# Patient Record
Sex: Male | Born: 1955 | Race: White | Hispanic: No | Smoking: Current every day smoker
Health system: Southern US, Community
[De-identification: ages and names within clinical notes are randomized; demographics above are authoritative.]

## PROBLEM LIST (undated history)

## (undated) DIAGNOSIS — I10 Essential (primary) hypertension: Secondary | ICD-10-CM

## (undated) DIAGNOSIS — N289 Disorder of kidney and ureter, unspecified: Secondary | ICD-10-CM

## (undated) DIAGNOSIS — E785 Hyperlipidemia, unspecified: Secondary | ICD-10-CM

## (undated) DIAGNOSIS — I2510 Atherosclerotic heart disease of native coronary artery without angina pectoris: Secondary | ICD-10-CM

## (undated) DIAGNOSIS — I219 Acute myocardial infarction, unspecified: Secondary | ICD-10-CM

## (undated) HISTORY — DX: Acute myocardial infarction, unspecified: I21.9

## (undated) HISTORY — DX: Hyperlipidemia, unspecified: E78.5

## (undated) HISTORY — DX: Atherosclerotic heart disease of native coronary artery without angina pectoris: I25.10

## (undated) HISTORY — PX: CORONARY ARTERY BYPASS GRAFT: SHX141

## (undated) HISTORY — PX: OTHER SURGICAL HISTORY: SHX169

## (undated) HISTORY — DX: Essential (primary) hypertension: I10

---

## 2004-01-03 ENCOUNTER — Inpatient Hospital Stay (HOSPITAL_COMMUNITY): Admission: EM | Admit: 2004-01-03 | Discharge: 2004-01-23 | Payer: Self-pay | Admitting: Cardiology

## 2004-01-10 ENCOUNTER — Encounter: Payer: Self-pay | Admitting: Cardiology

## 2004-07-16 ENCOUNTER — Ambulatory Visit: Payer: Self-pay | Admitting: Cardiology

## 2006-04-25 ENCOUNTER — Ambulatory Visit: Payer: Self-pay | Admitting: Cardiology

## 2006-06-23 ENCOUNTER — Ambulatory Visit: Payer: Self-pay | Admitting: Cardiology

## 2006-06-23 ENCOUNTER — Encounter: Payer: Self-pay | Admitting: Cardiology

## 2006-06-25 ENCOUNTER — Ambulatory Visit: Payer: Self-pay | Admitting: Cardiology

## 2006-06-25 ENCOUNTER — Encounter: Payer: Self-pay | Admitting: Cardiology

## 2006-07-15 ENCOUNTER — Encounter: Payer: Self-pay | Admitting: Cardiology

## 2008-06-10 ENCOUNTER — Ambulatory Visit: Payer: Self-pay | Admitting: Cardiology

## 2008-06-20 ENCOUNTER — Encounter: Payer: Self-pay | Admitting: Cardiology

## 2008-06-20 ENCOUNTER — Ambulatory Visit: Payer: Self-pay | Admitting: Cardiology

## 2009-02-21 DIAGNOSIS — E785 Hyperlipidemia, unspecified: Secondary | ICD-10-CM | POA: Insufficient documentation

## 2009-05-17 ENCOUNTER — Encounter: Payer: Self-pay | Admitting: Cardiology

## 2009-06-20 ENCOUNTER — Encounter: Payer: Self-pay | Admitting: Cardiology

## 2009-07-03 ENCOUNTER — Ambulatory Visit: Payer: Self-pay | Admitting: Cardiology

## 2009-07-03 DIAGNOSIS — I2510 Atherosclerotic heart disease of native coronary artery without angina pectoris: Secondary | ICD-10-CM

## 2009-07-03 DIAGNOSIS — F17200 Nicotine dependence, unspecified, uncomplicated: Secondary | ICD-10-CM

## 2009-07-03 DIAGNOSIS — I10 Essential (primary) hypertension: Secondary | ICD-10-CM

## 2009-07-17 ENCOUNTER — Encounter: Payer: Self-pay | Admitting: Cardiology

## 2009-07-19 ENCOUNTER — Encounter (INDEPENDENT_AMBULATORY_CARE_PROVIDER_SITE_OTHER): Payer: Self-pay | Admitting: *Deleted

## 2010-01-18 ENCOUNTER — Encounter: Payer: Self-pay | Admitting: Cardiology

## 2010-05-22 ENCOUNTER — Encounter (INDEPENDENT_AMBULATORY_CARE_PROVIDER_SITE_OTHER): Payer: Self-pay | Admitting: *Deleted

## 2010-05-22 ENCOUNTER — Ambulatory Visit: Payer: Self-pay | Admitting: Cardiology

## 2010-05-25 ENCOUNTER — Ambulatory Visit: Payer: Self-pay | Admitting: Cardiology
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
## 2010-09-04 NOTE — Letter (Signed)
Summary: Lexiscan or Dobutamine Pharmacist, community at [facility]  [address]   [place], [place] [address]   Phone: [phone]  Fax: [phone]      [facility] Cardiovascular Services  Lexiscan or Dobutamine Cardiolite Strss Test    [facility]  Appointment Date:_  Appointment Time:_  Your doctor has ordered a CARDIOLITE STRESS TEST using a medication to stimulate exercise so that you will not have to walk on the treadmill to determine the condition of your heart during stress. If you take blood pressure medication, ask your doctor if you should take it the day of your test. You should not have anything to eat or drink at least 4 hours before your test is scheduled, and no caffeine, including decaffeinated tea and coffee, chocolate, and soft drinks for 24 hours before your test.  You will need to register at the Outpatient/Main Entrance at the hospital 15 minutes before your appointment time. It is a good idea to bring a copy of your order with you. They will direct you to the Diagnostic Imaging (Radiology) Department.  You will be asked to undress from the waist up and given a hospital gown to wear, so dress comfortably from the waist down for example: Sweat pants, shorts, or skirt Rubber soled lace up shoes (tennis shoes)  Plan on about three hours from registration to release from the hospital  You may take your medications with water the morning of your test.   If the results of your test are normal or stable, you will receive a letter. If they are abnormal, the nurse will contact you by phone.

## 2010-12-18 NOTE — Assessment & Plan Note (Signed)
Frederick Malone                          Frederick Malone   Frederick Malone                     MRN:          [id number]  DATE:06/10/2008                            DOB:          1956-02-26    PRIMARY CARE PHYSICIAN:  Frederick Chute, MD   REASON FOR VISIT:  Cardiac followup.   HISTORY OF PRESENT ILLNESS:  Frederick Malone was seen back in the office back  in November 2007.  He has a history of cardiovascular disease, status  post previous inferior wall myocardial infarction treated with bare-  metal stent placement of the right coronary artery followed ultimately  by three-vessel coronary artery bypass grafting in 2005.  He had a  perioperative myocardial infarction secondary to subacute stent  thrombosis requiring thrombectomy 4 days postoperatively.  Since then he  has done very well.  His last Cardiolite was in 2007, showing a small  inferior basal fixed defect with no ischemia and an ejection fraction  calculated at 41%, although noted to be higher echocardiographically in  the past.  He continues to drive trucks locally for [employer] and is  due for a DOT physical with followup stress testing.  He is not  reporting any angina.  He does have some congestion but continues to  smoke half pack per day tobacco.  I spoke with about smoking cessation.  He reports that his lipids are followed by Dr. Neita Malone and he continues  on medications outlined below without any complaints.  He is not  exercising regularly.   ALLERGIES:  No known drug allergies.   PRESENT MEDICATIONS:  1. Aspirin 81 mg p.o. daily.  2. Lopressor 25 mg one-half tablet p.o. b.i.d.  3. Lisinopril 10 mg p.o. daily.  4. Crestor 10 mg p.o. daily.  5. Sublingual nitroglycerin 0.4 mg p.r.n.   REVIEW OF SYSTEMS:  As per history of present illness.  No cough,  hemoptysis, fevers, or chills.  He is not reporting any claudication.  Otherwise, negative.   PHYSICAL EXAMINATION:   VITAL SIGNS:  Blood pressure checked by me,  156/96, heart rate 73, weight 186 pounds, which is stable.  The patient  is comfortable and no acute distress.  HEENT:  Conjunctivae looks normal.  Oropharynx is clear.  Poor  dentition.  NECK:  Supple.  No elevated jugular venous pressure.  No carotid bruits.  No thyromegaly.  LUNGS:  Clear with diminished breath sounds.  No active wheezing.  CARDIAC:  Regular rate and rhythm.  Soft systolic murmur, preserved  second heart sound.  No pericardial rub or S3 gallop.  ABDOMEN:  Soft,  nontender.  No bruits.  EXTREMITIES:  Exhibit no frank pitting edema.  Distal pulses are 1+.  SKIN:  Warm and dry.  MUSCULOSKELETAL:  No kyphosis noted.  NEUROPSYCHIATRIC:  The patient is alert and oriented x3.  Affect is  appropriate.   Electrocardiogram today shows normal sinus rhythm with evidence of  previous inferior wall infarct demonstrated by Q-waves, which are old.   IMPRESSION AND RECOMMENDATIONS:  1. Cardiovascular disease as detailed above, status post previous  inferior wall myocardial infarction and coronary artery bypass      grafting in 2005.  Left ventricular ejection fraction has ranged      between 40-50% based on varying modalities.  He is due for a      followup stress test with DOT physical.  I will arrange an      adenosine Cardiolite with low-level ambulation on medical therapy.      If this is low-risk, his subsequent followup will be in one year's      time.  An echocardiogram will also be obtained to better assess his      overall ventricular function.  He may need some further medication      adjustments.  2. Hypertension, not well-controlled today.  I have asked him to keep      an eye on this and follow up with Dr. Neita Malone.  3. Hyperlipidemia, on Crestor.  We will obtain recent labs done      through Frederick Malone office for review.  Ideally, goal LDL should      be around 70.  4. We discussed smoking cessation today as  well.     Frederick Sidle, MD  Electronically Signed    SGM/MedQ  DD: 06/10/2008  DT: 06/10/2008  Job #: [id number]   cc:   Frederick Chute, MD
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
## 2010-12-21 NOTE — Op Note (Signed)
NAME:  Frederick Malone, Frederick Malone                          ACCOUNT NO.:  [id number]   MEDICAL RECORD NO.:  [id number]                   PATIENT TYPE:  INP   LOCATION:  2399                                 FACILITY:  [facility]   PHYSICIAN:  Guadalupe Maple, M.D.               DATE OF BIRTH:  1956-04-21   DATE OF PROCEDURE:  01/06/2004  DATE OF DISCHARGE:                                 OPERATIVE REPORT   PROCEDURE:  Intraoperative transesophageal echocardiography.   ANESTHESIOLOGIST:  Guadalupe Maple, M.D.   INDICATION:  Mr. [name] is a 55 year old white male with a history of  coronary artery disease, who was admitted on Jan 03, 2004 with an acute  myocardial infarction.  He is scheduled today to undergo coronary artery  bypass grafting by Dr. Purcell Nails.  Intraoperative transesophageal  echocardiography was requested to evaluate the left ventricle and determine  if any valvular pathology was present.   DESCRIPTION OF PROCEDURE:  The patient was brought to the operating room at  [facility] and general anesthesia was induced without difficulty.  The trachea was then intubated without difficulty.  The transesophageal  echocardiography probe was then inserted into the esophagus without  difficulty.   IMPRESSION:   PRE-BYPASS FINDINGS:  1. Moderate left ventricular dysfunction.  There appeared to be diffuse     hypokinesis of the left ventricle with no focal wall motion defects that     could be appreciated clearly.  The left ventricle was moderately dilated     with an ejection fraction estimated at 40% to 45%.  There was no thrombus     noted in the left ventricular apex.  Left ventricular wall thickness was     1.4 cm at end-diastole at the mid-capillary level of the anterior wall.  2. The mitral valve appeared normal.  The leaflets were thin and pliable,     coapted well without prolapse or fluttering and there was trace mitral     insufficiency.  3. Normal-appearing  aortic valve.  It was a trileaflet structure without     calcification which opened normally and there was no aortic     insufficiency.  4. The right ventricle was moderately dilated but appeared to contract     normally.  5. The tricuspid valve showed 1+ tricuspid insufficiency but otherwise     appeared normal.  6. The interatrial septum showed moderate lipomatous hypertrophy of the     interatrial septum but there was no evidence of patent foramen ovale or     atrioseptal defect.  7. No thrombus could be appreciated in the left atrium or left atrial     appendage.  8. The proximal ascending aorta appeared to have a normal contour and no     significant atheromatous disease.  9. The descending thoracic aorta showed a slight amount of atheromatous     disease but  appeared to be of normal size and was 2.55 cm in diameter.                                               Guadalupe Maple, M.D.    DCJ/MEDQ  D:  01/06/2004  T:  01/07/2004  Job:  [id number]

## 2010-12-21 NOTE — Assessment & Plan Note (Signed)
[facility]                            [facility] OFFICE NOTE   ELON, EOFF                     MRN:          [id number]  DATE:04/25/2006                            DOB:          May 08, 1956    PRIMARY CARDIOLOGIST:  Simona Huh, M.D.   REASON FOR OFFICE VISIT:  Annual followup.   [name] is a 55 year old male, with known coronary artery disease, last  seen here in the clinic in December 2005, by Dr. Diona Browner.  At that time, he  presented with history of acute inferior MI, treated with emergent stenting  of the RCA and subsequent elective 3-vessel CABG with grafting of the LMA to  LAD, RIMA to first diagonal, left radial artery to obtuse marginal branch in  June 2005.  The patient's postoperative course notable for perioperative MI  secondary to subacute stent thrombosis requiring treatment with thrombectomy  4 days postop.  His course was complicated by cardiogenic shock secondary to  RV infarct and required treatment with mechanical ventilation.  Of note, the  initial culprit lesion was treated with bare metal stenting.   Following his last clinic visit, the patient was scheduled for an adenosine  stress Cardiolite for further evaluation of an atypical chest discomfort.  This was reviewed by Dr. Diona Browner who noted some peri-infarct ischemic in  the inferior wall with no large major perfusion defect; calculated ejection  fraction 49%.   Since then, the patient reports that he has been noncompliant with his  medications, having taken only low-dose aspirin on a regular basis.  He has,  otherwise, not resumed any of his other medications once he had run out.   Regarding symptoms, he complains of generalized aches and pains much of  which appears to be musculoskeletal.  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  He also takes ibuprofen for his  discomfort.  In fact, he states  that he has constant chest pressure and he also has chronic exertional  dyspnea.  Regarding the latter, he also continues to smoke (1 to 2 packs a  day) and has been doing so since the age of 29.   CURRENT MEDICATIONS:  Aspirin 81 mg daily.   PHYSICAL EXAMINATION:  Blood pressure 114/72.  Pulse 80, regular.  Weight  177.  GENERAL:  A 55 year old male in no apparent distress.  HEENT:  Normocephalic atraumatic.  NECK:  Palpable bilateral carotid pulses without bruits.  LUNGS:  Diminished breath sounds at the bases but without crackles or  wheezes.  HEART:  Regular rate rhythm (S1, S2).  Soft S4.  No significant murmurs.  ABDOMEN:  Soft, nontender with intact bowel sounds.  EXTREMITIES:  Brisk bilateral peripheral pulses with no edema.  NEUROLOGIC:  No focal deficit.   IMPRESSION:  1. Coronary artery disease.      a.     Status post acute inferior myocardial infarction/emergent bare       metal stenting, right coronary artery with subsequent 3-vessel       coronary  artery bypass graft.      b.     Perioperative myocardial infarction secondary to subacute stent       thrombosis treated with thrombectomy (4 days' postop):  Complicated by       associated cardiogenic shock secondary to right ventricular infarct;       and, associated ventilator-dependent respiratory failure.      c.     Low-risk adenosine stress Cardiolite; ejection fraction  49%,       December 2005.  2. Ongoing tobacco.  3. Hyperlipidemia.   PLAN:  The patient has not been seen in our clinic in nearly 2 years and has  been noncompliant with all of his medications, save for low-dose aspirin.  Therefore, my recommendation at this point in time is to get him back on  track and resume a beta blocker with Toprol XL 25 daily in light of his  history of myocardial infarction.  We will also place him on a statin with  Crestor 10 daily.  He will need followup fasting lipids/liver profile in  12  weeks.  At this point in time, the patient is resistant to any treatment  modalities to assist him with smoking cessation.  However, he will try to do  so on his own.  We will have him return in 2 months for continued close  followup.  His symptoms of chest pain are quite atypical at this point in  time, and, in fact, may be musculoskeletal in etiology.  Nevertheless, given  that he has not been on any cardiac medications for quite some time, I plan  to resume most of these medications and see how he does in the very near  future.  If, however, he continues to complain of breakthrough chest pain,  then we will need to consider either a repeat stress test or a diagnostic  cardiac catheterization.                                   [name], PA-C                                Jonelle Sidle, MD   GS/MedQ  DD:  04/25/2006  DT:  04/28/2006  Job #:  [id number]

## 2010-12-21 NOTE — Assessment & Plan Note (Signed)
[facility]                            [facility] OFFICE NOTE   Frederick, Malone                     MRN:          [id number]  DATE:06/23/2006                            DOB:          25-May-1956    HISTORY OF PRESENT ILLNESS:  The patient is a 55 year old male with a  history of coronary bypass grafting. His postoperative course was  complicated by periods of myocardial infarction secondary to subacute stent  thrombosis from a previously placed stent in the right coronary artery.   The patient presented back for followup on April 25, 2006. He had a  stress test done in 2005 which showed no definite ischemia. The patient  reported he had been noncompliant with his medical regimen and Frederick Malone  started him back on aspirin and Toprol XL as well as Crestor. His ejection  fraction is 49% on the last assessment.   The patient stated that he had an episode of substernal chest pain last  Tuesday night and he was told at work that he had to go home.  He took 2  nitroglycerin tablets with some resolution in pain. The patient's has a  historyof  chronic chest pain and his pain is really musculoskeletal in  nature. He takes frequent ibuprofen. He also has pain in multiple joints. 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 The patient today also on  palpitation of his chest reports substernal chest pain but has no worsening  exertional chest pain or dyspnea. Unfortunately, he continues to smoke.   MEDICATIONS:  1. Aspirin 81 mg a day.  2. Toprol XL 25 mg a day.  3. Crestor 10 mg p.o. daily.   PHYSICAL EXAMINATION:  VITAL SIGNS:  Blood pressure 158/90, heart rate 72  beats per minute, weight is 185 pounds.  NECK:  Normal carotid upstrokes, no carotid bruits.  LUNGS:  Clear breath sounds bilaterally.  HEART:  Regular rate and rhythm. Normal S1, S2.  ABDOMEN:   Soft.  EXTREMITIES:  No edema.   ECG normal sinus rhythm with old inferior wall myocardial infarction,  inverted T waves in the inferior leads.   PROBLEM LIST:  1. Coronary artery disease.      a.     Status post acute inferior wall myocardial infarction with       emergent bare metal stent to the right coronary artery.      b.     Followed by three-vessel coronary artery bypass grafting in       2005.      c.     Perioperative myocardial infarction secondary to subacute stent       thrombosis with thrombectomy 4 days postop.  2. Recurrent substernal chest pain with atypical features.  3. Ongoing tobacco use.  4. Dyslipidemia.  5. Previous noncompliance with medial therapy. Now patient reports      compliance.   PLAN:  1. The patient's chest pain is atypical. However it has been almost 3  years since his last stress test and I have ordered an exercise      Cardiolite study.  2. The patient can continue to take p.r.n. nitroglycerin as needed.      Although again as outlined, I do not think the patient's chest pain is      anginal in nature. Further evaluation may be required if the stress      test is positive.  3. The patient's ejection fraction appears to be depressed. Will      reevaluate on the stress test particularly in light of his      hypertension. The addition of an ACE inhibitor appears to be indicated.  4. The patient's appointment for December 5 will be rescheduled to 3      months from now.     Learta Codding, MD,FACC  Electronically Signed    GED/MedQ  DD: 06/23/2006  DT: 06/23/2006  Job #: [id number]

## 2010-12-21 NOTE — Discharge Summary (Signed)
NAME:  Frederick Malone, Frederick Malone                          ACCOUNT NO.:  [id number]   MEDICAL RECORD NO.:  [id number]                   PATIENT TYPE:  INP   LOCATION:  4742                                 FACILITY:  [facility]   PHYSICIAN:  Olga Millers, M.D. [facility]            DATE OF BIRTH:  1955-08-22   DATE OF ADMISSION:  01/03/2004  DATE OF DISCHARGE:  01/23/2004                           DISCHARGE SUMMARY - REFERRING   PROCEDURES:  1. Emergent coronary angiogram/stenting, right coronary artery May 31.  2. Three vessel coronary artery bypass surgery June 3.  3. Emergent coronary angiogram/thrombectomy right coronary artery June 7.  4. Venous Doppler is June 14.   REASON FOR ADMISSION:  The patient is a 55 year old male with no prior  history of heart disease who initially presented to [facility]  emergency room with acute inferior myocardial infarction and was transferred  for emergent percutaneous intervention.  Please refer to admission note for  full details.   LABORATORY DATA:  Sodium 136, potassium 4.5, glucose 123, BUN 11, creatinine  0.8 at discharge.  WBC 6.9, hemoglobin 10.6, hematocrit 31, MCV 89,  platelets 555 pre-discharge.  Hemoccult negative.  Total protein 9, albumin  2.1, elevated ALT 68, alkaline phosphatase 152 pre-discharge.  Peak liver  enzymes, AST 106, ALT 147, alkaline phosphatase 226 on June 14.  BNP 584 on  June 16.  C.difficile toxin negative.  Renal function remains stable with  peak BUN 41/creatinine 1.3 on June 9.  D. dimer 11.5 on June 12.  Hemoglobin  A1C 5.4.  Cardiac enzymes:  Peak CPK 1378/148; peak troponin I 23.56.  Lipid  profile:  Total cholesterol 151, triglyceride 120, HDL 48, LDL 79  (cholesterol/HDL ration 3.1).  TSH 3.23.  Blood, urine and respiratory  culture all reveal no growth.   Chest x-ray on June 16:  Cardiomegaly, mild RML atelectasis.   Abdominal ultrasound:  Multiple, tiny gallstones and sludge.  No biliary  dilatation;  unremarkable liver appearance.   Chest CT:  Mild interstitial edema/small bilateral pleural effusions; mild  postoperative pneumopericardium; RLL atelectasis versus consolidation.  Pelvis CT:  No inflammatory process or abscess.  Mild diffuse body wall  edema.   HOSPITAL COURSE:  Following stabilization at [facility] emergency  room where patient presented with acute inferior myocardial infarction.  The  patient was transferred taken directly to the cardiac catheterization lab  and underwent emergent percutaneous intervention by Dr. Randa Evens (see  report for full details).  For treatment of a 99% proximal RCA lesion with  TIMI I flow.  This is treated with a multi-length stent with no noted  complications.  LV function:  EF 53% with inferior hypokinesis.  Residual  70% proximal/mid LAD and 80% obtuse marginal disease noted.   Dr. Samule Ohm recommended holding Plavix in preparation for possible CABG.  The  patient was seen in consultation by Dr. Cornelius Moras who agreed to proceed with  bypass surgery.  Preoperative studies revealed no significant ICA stenosis.   The patient underwent successful three vessel CABG on hospital day #3 by Dr.  Tressie Stalker with grafting of the LIMA/LAD; free RIMA-first diagonal; left  radial artery-obtuse marginal.  There were no noted complications.  The  patient was transferred to SICU in stable condition.   Immediate postoperative course notable for evidence of vasodilatation with  systolic blood pressure 100.  Dr. Laneta Simmers, therefore, recommended  discontinuing nitroglycerin and weaning Neo-Synephrine.  The patient also  noted to develop a postop fever which was initially treated with  antibiotics.  The patient was subsequently cultured extensively, but yielded  no evidence of infection.  The patient also was referred to the Infectious  Disease team for further evaluation.  Additional studies were recommended  consisting of CT scans of the chest and  abdomen as well as abdominal  ultrasonography.  Again, these revealed no significant abnormalities.  They  suggested possible drug fever reaction, citing multiple possible suspects.  The leukocytosis did subsequently normalize and the patient remained  afebrile by time of discharge.   On postoperative day #3, the patient was noted to have elevated cardiac  enzymes consistent with perioperative myocardial infarction.  However, the  patient denied any chest discomfort, and no dysrhythmias were noted.  Heparin was resumed, and following consultation with the Cardiology Team,  recommendation was to return to the cardiac catheterization laboratory.   The patient did subsequently develop dynamic EKG changes requiring  initiation of nitroglycerin, as well as heparin.  Dr. Cornelius Moras notified Dr.  Samule Ohm, and following transfusion with two units packed RBC's, the patient  was taken back to the catheterization lab.   Dr. Samule Ohm performed the procedure and found the stent occluded with  thrombus and heavy thrombus burden throughout the vessel.  There was a  residual, nonocclusive thrombus in the distal RCA.  He proceeded with  successful export thrombectomy with rejection to 0% residual stenosis.  All  three bypass grafts were widely patent.  Ejection fraction was approximately  45%.  Dr. Samule Ohm concluded that this represents subacute stent thrombosis.  The patient also was felt to be in cardiogenic shock secondary to RV  infarct, primarily.  He was placed on a balloon pump and ventilatory  support.   Dr. Samule Ohm reviewed a follow up echocardiogram confirming preserved overall  LV function with poor RV function.  He noted no evidence of __________.  The  patient was placed on vasopressor support for treatment of hypotension.   The patient had a prolonged postoperative and post acute intervention course  with gradual, but progressive improvement.  He required continued ventilatory support, and as  noted earlier, had a persistent fever requiring  treatment with IV antibiotics.  He also was treated for mild pulmonary  edema, but did develop mild renal insufficiency which subsequently  normalized.  Postoperative anemia was also corrected with RBC transfusion.   Dr. Donata Clay noted an elevated D. dimer and placed the patient on Lovenox  DVT prophylaxis.  He ordered venous Dopplers which were negative for DVT.   Given the persistent fevers, and no associated leukocytosis, Dr. Marcos Eke  noted that this may, in fact, represent and inflammatory response versus  questionable withdrawal.  The patient also noted to have history of alcohol  abuse.   Elevated liver enzymes were noted and felt to represent liver shock.  The  patient was re-challenged with low dose statin (Lipitor 10) prior to  discharge.  Liver enzymes were trending down  by time of discharge.  The  patient will need early follow up LFT's at time of scheduled office visit.   On hospital day #15, the patient was finally able to be successfully  extubated.   The patient continued to remain stable following extubation, with the  progressive improvement and no complaints of chest pain or dyspnea.  Medications were gradually titrated.  Hypokalemia repeated and IV  antibiotics discontinued.  The patient was also converted to oral diuretics.  Noting the improvement in liver enzymes, felt most likely secondary to shock  liver/hepatic congestion, Dr. Jens Som recommended re-challenge with statin  (as previously noted).   Following these final recommendations, the patient remained stable over the  weekend and was good for discharge on Monday, June 20, in stable condition.   At the time of discharge, it was noted that the patient would require  financial assistance for his medications.  The patient will be referred to  our clinic in [facility] for medication samples.   Also, at the time of discharge, the patient was seen by CVTS who noted  no  additional recommendations.  Arrangements were made for a follow up visit  with Dr. Tressie Stalker.   DISCHARGE MEDICATIONS:  1. Plavix 75 mg daily.  2. Aspirin 325 mg daily.  3. Lopressor 15 mg b.i.d.  4. Altace 2.5 mg daily.  5. Lasix 40 mg daily.  6. Potassium 20 mEq daily.  7. Lipitor 10 mg daily.  8. Nitrostat 0.4 mg p.r.n.  9. Vitamin B1 100 mg daily.   DISCHARGE INSTRUCTIONS:  The patient is to refrain from any strenuous  activity and is to refrain from driving until further instructions.  He is  to maintain a low-fat/cholesterol diet.  He is to refrain from tobacco  smoking and from drinking alcohol.   The patient will follow up with Dr. Remi Deter McDowell/Michael Elise Benne, P.A. on  Tuesday, June 28, at 3 p.m. at [facility] in [place].  He will need  follow up liver enzymes drawn.  The patient will follow up with Dr. Tressie Stalker, Monday, July 11, at 1:30 p.m.   DISCHARGE DIAGNOSES:  1. Coronary artery disease.    A. Acute inferior MI/emergent stenting, right coronary artery, May 31.     B. Elective 3 vessel coronary artery bypass graft (LIMA-LAD); RIMA-TX1;        left radial artery-OM) June 3.     C. Perioperative myocardial infarction secondary to subacute stent        thrombosis, treated with thrombectomy, June 7; associated Cardiogenic        shock secondary to RV infarct; ventilator dependent respiratory        failure.  2. Associated cardiogenic shock secondary to RV infarct; ventilator     dependent respiratory failure.  3. Elevated liver enzymes.  Secondary to liver shock; hepatic congestion.  4. Postoperative fever.  Negative cultures.  5. Alcohol withdrawal.  6. History of hypertension.  7. Tobacco.  8. Postoperative anemia.  9. Mild renal insufficiency.      [name], P.A. [facility]                      Olga Millers, M.D. [facility]    GS/MEDQ  D:  01/23/2004  T:  01/24/2004  Job:  [id number]   cc:   Salvatore Decent. Cornelius Moras, M.D.  [address]   [place]  [place] [address]   Fara Chute  [address]  [place]  [place]  [address]  Fax: [phone]   [facility]  [address]  [address]  [place], [place] [address]

## 2010-12-21 NOTE — Cardiovascular Report (Signed)
NAME:  Frederick Malone, Frederick Malone                          ACCOUNT NO.:  [id number]   MEDICAL RECORD NO.:  [id number]                   PATIENT TYPE:  INP   LOCATION:  NA                                   FACILITY:  [facility]   PHYSICIAN:  Salvadore Farber, M.D. [facility]         DATE OF BIRTH:  Dec 24, 1955   DATE OF PROCEDURE:  01/03/2004  DATE OF DISCHARGE:                              CARDIAC CATHETERIZATION   PROCEDURES:  1. Left heart catheterization.  2. Left ventriculography.  3. Coronary angiography.  4. Stent to the right coronary artery.  5. Angioseal closure of the right common femoral artery.   INDICATIONS:  Mr. Carmen is a 55 year old gentleman with hypertension and  ongoing tobacco abuse, who presents with acute inferior myocardial  infarction.  Pain onset was at 5 p.m.  He presented to [facility] at  7:25 p.m., where electrocardiogram demonstrated inferior ST elevations.  He  was treated with aspirin, heparin, and eptifibatide, and transferred for  primary percutaneous revascularization.   PROCEDURAL TECHNIQUE:  Informed consent was obtained.  Under 1% lidocaine  local anesthesia, a 6 French sheath was placed in the right common femoral  artery and a 6 French sheath in the right common femoral vein using the  modified Seldinger technique.  Diagnostic angiography was performed using  JL4 and JR4 catheters.  This demonstrated the culprit lesion to be a 99%  stenosis of the midportion of the right coronary artery with TIMI-2 flow.  A  decision was made to proceed to percutaneous revascularization.   Additional heparin was given to achieve and maintain an ACT of greater than  200 seconds.  A second bolus of eptifibatide was administered.  A 6 Sudan guide was advanced over a wire and engaged in the ostium of the RCA.  A  Luge wire was advanced beyond the lesion without difficulty.  The lesion was  then dilated using a 3.0 x 30 mm Maverick at 6 atmospheres.  There was  substantial residual thrombus with substantial residual stenosis.  The  lesion was therefore stented using a 3.5 x 28 mm Vision at 16 atmospheres.  The proximal portion of the stent was then post-dilated using a 4.0 x 18 mm  PowerSail, again at 16 atmospheres.  Final angiography demonstrated no  residual stenosis, no dissection, and TIMI-3 flow to the distal vasculature.  The patient tolerated the procedure well and will be transferred to the  intensive care unit in stable condition.   COMPLICATIONS:  None.   Pain onset 1700, catheterization lab arrival 2135, reperfusion 2206.   FINDINGS:  1. LV:  105/15/21.  EF 53% with inferior hypokinesis.  2. No aortic stenosis or mitral regurgitation.  3. Left subclavian:  No stenosis.  4. Left main:  20% ostial stenosis.  5. LAD:  The LAD is a large vessel giving rise to a single moderate-sized     diagonal.  There is a 70%  stenosis of the proximal vessel and a 60-70%     stenosis of the midvessel.  The diagonal has a 50% stenosis in its     midsection.  6. Circumflex:  A moderate-sized vessel giving rise to a large first and     small second obtuse marginal.  There is an 80% stenosis of the first     marginal.  7. RCA:  Large, dominant vessel.  There was a 99% stenosis of the midvessel.     This was treated with bare metal stenting to no residual stenosis.   IMPRESSION/RECOMMENDATION:  Successful stenting of the culprit lesion in the  right coronary artery.  Will review films and situation with colleagues  regarding further recommendation regarding treatment of his LAD and  circumflex.  Given strong consideration of CABG, will withhold Plavix this  evening.  Eptifibatide will be continued.                                               Salvadore Farber, M.D. [facility]    WED/MEDQ  D:  01/03/2004  T:  01/04/2004  Job:  [id number]   cc:   Fara Chute  [address] [place]  [place] [address]  Fax: [phone]

## 2010-12-21 NOTE — Op Note (Signed)
NAME:  VOYD, GROFT                          ACCOUNT NO.:  [id number]   MEDICAL RECORD NO.:  [id number]                   PATIENT TYPE:  INP   LOCATION:  2306                                 FACILITY:  [facility]   PHYSICIAN:  Salvatore Decent. Cornelius Moras, M.D.              DATE OF BIRTH:  [birth year]   DATE OF PROCEDURE:  01/06/2004  DATE OF DISCHARGE:                                 OPERATIVE REPORT   PREOPERATIVE DIAGNOSIS:  Severe three-vessel coronary artery disease, status  post acute myocardial infarction.   POSTOPERATIVE DIAGNOSIS:  Severe three-vessel coronary artery disease,  status post acute myocardial infarction.   PROCEDURE:  Median sternotomy for coronary artery bypass grafting x3 (left  internal mammary artery to distal left anterior descending coronary artery,  free right internal mammary artery to first diagonal branch, left radial  artery to circumflex marginal branch, endoscopic left radial artery  harvest).   SURGEON:  Salvatore Decent. Cornelius Moras, M.D.   ASSISTANT:  Coral Ceo, P.A.   SECOND ASSISTANT:  Ms. Waynetta Sandy __________.   ANESTHESIA:  General.   BRIEF CLINICAL NOTE:  The patient is a 55 year old male with no previous  cardiac history, who was admitted to the hospital on May 31 with an acute  inferior myocardial infarction.  He was taken directly to the cardiac  catheterization lab, where cardiac catheterization reveals severe three-  vessel coronary artery disease, including acute occlusion of the proximal  right coronary artery.  The patient underwent percutaneous coronary  intervention with stent placement in the proximal right coronary artery with  excellent results.  The patient has significant high-grade residual coronary  artery disease in the left anterior descending coronary artery and the left  circumflex territories, and surgical revascularization has been recommended  as the best long-term treatment option.  The patient has remained entirely  stable following his  acute myocardial infarction and subsequent percutaneous  coronary intervention.  The patient and his family have been counseled at  length regarding the indications and potential benefits of surgery.  Alternative treatment strategies have been discussed.  They understand and  accept all associated risks of surgery, including but not limited to risk of  death, stroke, myocardial infarction, congestive heart failure, pneumonia,  respiratory failure, bleeding requiring blood transfusion, arrhythmia,  infection, and recurrent coronary artery disease.  The possibility of  multivessel angioplasty has also been reviewed, and the relative risks and  benefits of surgical revascularization are discussed.  The rationale for use  of multiple arterial conduits has also been discussed.  All of their  questions have been addressed.   OPERATIVE NOTE IN DETAIL:  The patient is brought to the operating room on  the above-mentioned date and central monitoring is established by the  anesthesia service under the care and direction initially of Guadalupe Maple, M.D.  Specifically, a Swan-Ganz catheter is placed through the right  internal jugular approach.  A right  radial arterial line is placed.  Intravenous antibiotics are administered.   With the patient in the preoperative holding area, the patient's left hand  is carefully examined.  A pulse oximetry probe is placed on the patient's  left index finger and the pulse oximetry wave form continuously monitored  while the left radial pulse is briefly occluded.  There remains a normal  pulse oximetry wave form despite occlusion of the radial pulse, confirming  the presence of intact palmar arch circulation.   The patient is brought to the operating room and placed in the supine  position on the operating table.  General endotracheal anesthesia is induced  uneventfully under the care and direction of Dr. Kipp Brood.  Baseline  transesophageal echocardiogram  is performed, and this demonstrates mild left  ventricular dysfunction with mild inferior wall hypokinesis, overall  ejection fraction estimated between 45 and 50%.  No other significant  abnormalities are noted.   A Foley catheter is placed.  The patient's chest, left upper extremity,  abdomen, both groins, and both lower extremities are prepared and draped in  a sterile manner.   The left radial artery is harvested to be utilized as conduit for bypass  grafting using endoscopic harvest technique.  This is performed through a  small longitudinal incision on the distal aspect of the volar aspect of the  left forearm.  This is performed under a tourniquet, and the total  tourniquet time for the procedure is 21 minutes.  The proximal end of the  artery is oversewn and the arterial conduit is subsequently flushed with  heparinized papaverine solution.  There is excellent backbleeding within the  artery.  The distal end of the artery is now transected and the distal stump  is oversewn with suture ligatures.  The arterial conduit is removed and  placed in a small container with heparinized blood containing papaverine for  storage.  The small incisions in the forearm are now closed in multiple  layers with running absorbable suture.   A median sternotomy incision is performed and the left internal mammary  artery is dissected from the chest wall and prepared for bypass grafting.  The left internal mammary artery is good-quality conduit.  Following this  the right internal mammary artery is also dissected from the chest wall and  prepared for bypass grafting.  The right internal mammary artery is good-  quality conduit, although it is relatively short in length.  The patient is  heparinized systemically.   The pericardium is opened.  The ascending aorta is normal in appearance.  The ascending aorta and right atrium are cannulated for cardiopulmonary bypass.  Adequate heparinization is  verified.  Cardiopulmonary bypass is  begun and the surface of the heart is inspected.  There is mild dusky  appearance in the inferior wall consistent with recent myocardial  infarction, although overall the majority of the myocardium looks relatively  normal and there appears to be a minimal amount of damage from the recent  myocardial infarction.  Distal sites are selected for coronary bypass  grafting.  An attempt is performed to utilize the right internal mammary  artery as an in-situ graft.  However, the right internal mammary artery is  too short to reach either the left anterior descending coronary artery, the  diagonal branch, or the circumflex marginal branch in situ.  Therefore, the  right internal mammary artery is now transected proximally and the proximal  stump is oversewn with suture ligatures.  A temperature probe  is placed in  the left ventricular septum.  A cardioplegia catheter is placed in the  ascending aorta.   The patient is cooled to 32 degrees systemic temperature.  The aortic  crossclamp is applied and cardioplegia is delivered in an antegrade fashion  through the aortic root.  Iced saline slush is applied for topical  hypothermia.  The initial cardioplegic arrest and myocardial cooling are  felt to be excellent.  Repeat doses of cardioplegia are administered  intermittently throughout the crossclamp portion of the operation both  through the aortic root and down the subsequently-placed radial artery graft  to maintain septal temperature below 15 degrees Centigrade.  The following  distal coronary anastomoses are performed:  (1) The circumflex marginal  branch is grafted with the left radial artery in an end-to-side fashion.  This coronary measures 1.8 mm in diameter at the site of distal bypass and  is of good quality.  It is diffusely diseased proximally and the distal  anastomosis is somewhat far distal, but the vessel is good-quality at the  site of distal  bypass.  The left radial artery conduit is good-quality.  (2)  The first diagonal branch off the left anterior descending coronary artery  is grafted with a free right internal mammary artery in an end-to-side  fashion.  The diagonal branch measures 1.5 mm in diameter at the site of  distal bypass and is of good quality.  It is diffusely diseased proximally.  (3) The distal left anterior descending coronary artery is grafted with the  left internal mammary artery in an end-to-side fashion.  This coronary  measures 1.5 mm in diameter at the site of distal bypass and is of fair  quality.  There is some posterior plaque, and a long arteriotomy is  subsequently performed.  The vessel is otherwise good-quality and the  internal mammary artery conduit is good-quality.   Both proximal anastomoses are performed directly to the ascending aorta  prior to removal of the aortic crossclamp.  The septal temperature is noted to rise rapidly upon reperfusion of the left internal mammary artery.  The  aortic crossclamp is removed after removing evacuating any residual air  through the aortic root.  The aortic crossclamp time is 100 minutes.   The heart begins to beat spontaneously without need for cardioversion.  All  proximal and distal anastomoses are inspected for hemostasis and appropriate  graft orientation.  Epicardial pacing wires are affixed to the right  ventricular outflow tract and to the right atrial appendage.  The patient is  rewarmed to 37 degrees Centigrade temperature.  The patient is weaned from  cardiopulmonary bypass without difficulty.  The patient's rhythm at  separation from bypass is normal sinus rhythm.  No inotropic support is  required.  Total cardiopulmonary bypass time for the operation is 126  minutes.   Follow-up transesophageal echocardiogram performed by Dr. Gypsy Balsam after  separation from bypass demonstrates preserved left ventricular function with  no other significant  abnormalities.   The venous and arterial cannulae are removed uneventfully.  Protamine is  administered to reverse the anticoagulation.  The mediastinum and both left  and right pleural spaces are irrigated with saline solution containing  vancomycin.  Meticulous surgical hemostasis is ascertained.  The mediastinum  and the left and right pleural spaces are both drained with four chest tubes  placed through separate stab incisions inferiorly.  The median sternotomy is  closed using double-strength sternal wire.  The soft tissues anterior to the  sternum are closed in multiple layers, and the skin incision is closed with  a running subcuticular skin closure.   The patient tolerated the procedure well and is transported to the surgical  intensive care unit in stable condition.  There are no intraoperative  complications.  All sponge, instrument, and needle counts are verified  correct at completion of the operation.  No blood products were  administered.                                               Salvatore Decent. Cornelius Moras, M.D.    CHO/MEDQ  D:  01/06/2004  T:  01/07/2004  Job:  [id number]   cc:   Olga Millers, M.D. [facility]   Salvadore Farber, M.D. [facility]  [address]  [address]  [place]  [place] [address]   Fara Chute  [address] [place]  [place] [address]  Fax: [phone]   [facility], [place], [place]
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
## 2010-12-21 NOTE — Cardiovascular Report (Signed)
NAME:  Frederick Malone, WHIPP NO.:  [id number]   MEDICAL RECORD NO.:  [id number]                   PATIENT TYPE:  INP   LOCATION:  2931                                 FACILITY:  [facility]   PHYSICIAN:  Salvadore Farber, M.D. [facility]         DATE OF BIRTH:  May 17, 1956   DATE OF PROCEDURE:  01/10/2004  DATE OF DISCHARGE:                              CARDIAC CATHETERIZATION   PROCEDURE:  Left heart catheterization, left ventriculogram, coronary  angiography, stent to the RCA, Angio-Jet thrombectomy of the RCA, Export  catheter thrombectomy of the RCA, placement of intraaortic balloon pump.   INDICATION:  Mr. Furio is a 55 year old gentleman who suffered inferior  myocardial infarction on Jan 03, 2004.  I performed percutaneous  revascularization of the culprit lesion in the proximal RCA with placement  of a bare metal stent.  He had multivessel disease and subsequently  underwent coronary artery bypass grafting on January 06, 2004 by Dr. Cornelius Moras.  Plavix was initiated on the first postoperative day.  On the second  postoperative day, he had an episode of secondary heart block.  Cardiac  enzymes were assessed and found to be elevated again suggestive of recurrent  myocardial injury.  He was therefore scheduled for cardiac catheterization  to be performed today.  However, at approximately 8 o'clock this morning he  complained of mild substernal chest discomfort.  Repeat electrocardiogram  demonstrated inferior ST elevations and he was referred for emergent  angiography.  He arrived in cardiac catheterization lab with ongoing chest  discomfort and hemodynamically stable.   PROCEDURAL TECHNIQUE:  Informed consent was obtained.  Under 1% lidocaine  local anesthesia, a 6 French sheath was placed in the left common femoral  artery using the modified Seldinger technique.  A 6 [place] JR-4 guide was  advanced over wire and engaged in the ostium of the RCA.  This confirmed  thrombotic  occlusion of the RCA beginning at the stent.  Anticoagulation was  initiated with bivalirudin.  ACT was confirmed to be greater than 225  seconds.  I attempted to cross the occlusion initially with Luge wire  without success.  I was then also unsuccessful with a Whisper wire and a  Cross-It 200 wire.  During this period, the patient had progressive hypoxia  declining to 90% on 100% nonrebreather.  In addition, blood pressure fell to  the upper 80s.  Decision was made to proceed with placement of intraaortic  balloon pump and endotracheal intubation.   Anesthesia was consulted.  While they were making preparations for  intubation, a 40 cm balloon pump was placed via the pre-existing left common  femoral arterial access site.  Balloon tip was positioned in the proximal  descending aorta and counterpulsation initiated at 1:1.  Dopamine was also  initiated.  The patient was then intubated under the supervision of Dr.  Sampson Goon.   With him stabilized and returned to the coronary.  I  placed a 6 French  sheath in the right common femoral artery using the modified Seldinger  technique. An ART 3.5 guide was advanced over wire and engaged in the ostium  of the RCA.  A PT-2 wire was advanced with some difficulty across the lesion  and positioned in the distal RCA.  A 3.0 x 20-mm Maverick balloon was then  passed used to dodder the lesion.  No flow could be established.  I  therefore positioned it at the proximal level of occlusion and inflated it  to 6 atmospheres for 30 seconds.  With this, TIMI-2 flow was established  demonstrating a very heavy thrombus burden extending all the way from the  proximal portion of the stent which lay in the proximal RCA all the way  through the bifurcation of the PDA and PLV.  Decision was made to proceed to  Angio-Jet thrombectomy.  The patient had pre-existing epicardial pacing  wires.  These were connected to pacing box and capture confirmed.  Pacer was  set to  backup mode.  I then performed a total of six passes with an Angio-  Jet thrombectomy catheter with removal of a substantial amount of thrombus.  Despite this, there remained heavy thrombus at the distal margin of the  previously placed stent and also at the PDA, PLV bifurcation.  Even with  focusing Angio-Jet on these two areas, it could not be resolved.  In the  hopes of improving this, I administered intracoronary ReoPro via the guiding  catheter.  I then proceeded to place an additional stent across a heavy  thrombus burden that began in the distal portion of the stent and extended  distally.  Therefore, a 3.5 x 20-mm PowerSail 16 atmospheres.  There was an  area of residual stenosis that was further dilated using this balloon up to  22 atmospheres.   There remained residual thrombus at the PDA, PLV bifurcation.  I attempted  to remove this using an Export thrombectomy catheter.  Two passes were made  with no success.  Finally, I post dilated the proximal portion of the pre-  existing stent using a 4.5 mm PowerSail at 16 atmospheres.  Final angiogram  demonstrated modest residual thrombus within the previously placed stent and  moderate nonocclusive thrombus in the distal RCA extending into the proximal  portion of both the PLV and PDA.  Final angiography demonstrated TIMI-3 flow  to the distal vasculature.   I then proceeded to angiography of the native left circulation and the  bypass graft.  All bypass grafts were widely patent.  Left heart  catheterization ventriculography were then performed using a pigtail  catheter.   The patient was then transferred to the CCU with persistent shock.   COMPLICATIONS:  Respiratory failure requiring intubation, progressive shock.   FINDINGS:  1. Left main:  Ostial 20% stenosis.  2. LAD:  Moderate size vessel giving rise to a single diagonal branch.    There is a 70% stenosis of the proximal vessel and a 60% stenosis of the     mid vessel.   The LIMA to LAD touches down just in the mid vessel beyond     the stenosis.  It is widely patent with excellent flow.  The saphenous     veins at the diagonal branch are widely patent with  excellent flow.  3. Circumflex:  Moderate size vessel giving rise to a single obtuse     marginal.  This marginal has an 80% stenosis proximally.  The saphenous     vein graft to the marginal is widely patent with excellent flow.  4. RCA:  Vessel had subacute stent thrombus.  Treated as above with repeat     stenting to a more distal lesion and thrombectomy.   IMPRESSION/PLAN:  The patient with subacute stent thrombosis complicated by  respiratory failure and shock.  Shock appears primarily secondary to RV  failure as his left ventricular function is relatively preserved.  He will  be continued on ReoPro for at least 12 hours, maintained on intraaortic  balloon pump and maintained on bivalirudin until the balloon pump is ready  to be discontinued.                                               Salvadore Farber, M.D. [facility]    WED/MEDQ  D:  01/13/2004  T:  01/14/2004  Job:  [id number]   cc:   Fara Chute  [address] [place]  [place] [address]  Fax: [phone]

## 2011-09-24 ENCOUNTER — Telehealth: Payer: Self-pay | Admitting: Cardiology

## 2011-09-24 NOTE — Telephone Encounter (Signed)
04/06/11 & 07/03/11 & 09/24/11 reminder mailed to schedule f/u-srs °

## 2012-02-07 ENCOUNTER — Encounter: Payer: Self-pay | Admitting: Internal Medicine

## 2012-05-11 ENCOUNTER — Telehealth: Payer: Self-pay

## 2012-05-11 ENCOUNTER — Encounter: Payer: Self-pay | Admitting: *Deleted

## 2012-05-11 ENCOUNTER — Ambulatory Visit (INDEPENDENT_AMBULATORY_CARE_PROVIDER_SITE_OTHER): Payer: PRIVATE HEALTH INSURANCE | Admitting: Cardiology

## 2012-05-11 ENCOUNTER — Encounter: Payer: Self-pay | Admitting: Cardiology

## 2012-05-11 ENCOUNTER — Other Ambulatory Visit: Payer: Self-pay | Admitting: Cardiology

## 2012-05-11 VITALS — BP 134/82 | HR 64 | Ht 67.0 in | Wt 181.4 lb

## 2012-05-11 DIAGNOSIS — F17200 Nicotine dependence, unspecified, uncomplicated: Secondary | ICD-10-CM

## 2012-05-11 DIAGNOSIS — I10 Essential (primary) hypertension: Secondary | ICD-10-CM

## 2012-05-11 DIAGNOSIS — I2510 Atherosclerotic heart disease of native coronary artery without angina pectoris: Secondary | ICD-10-CM

## 2012-05-11 DIAGNOSIS — E785 Hyperlipidemia, unspecified: Secondary | ICD-10-CM

## 2012-05-11 NOTE — Patient Instructions (Addendum)
Your physician recommends that you schedule a follow-up appointment in: 1 year. You will receive a reminder letter in the mail in about 10 months reminding you to call and schedule your appointment. If you don't receive this letter, please contact our office. Your physician recommends that you continue on your current medications as directed. Please refer to the Current Medication list given to you today. Your physician has requested that you have a lexiscan myoview. For further information please visit [email or web address]. Please follow instruction sheet, as given.  

## 2012-05-11 NOTE — Assessment & Plan Note (Signed)
Symptomatically stable. ECG also reviewed and stable. 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 If no significant change noted in prior abnormalities, will likely continue observation in light of no progressive symptoms. Followup arranged.

## 2012-05-11 NOTE — Assessment & Plan Note (Signed)
He continues on Crestor. Followup lab work with Dr. Neita Carp. Goal LDL should be close to 70 if possible.

## 2012-05-11 NOTE — Telephone Encounter (Signed)
No precert required per [facility]. @ [phone]
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
## 2012-05-11 NOTE — Assessment & Plan Note (Signed)
We continue to discuss smoking cessation. He has not been able to quit. 

## 2012-05-11 NOTE — Assessment & Plan Note (Signed)
Blood pressure control is reasonable today. 

## 2012-05-11 NOTE — Telephone Encounter (Signed)
lexiscan myoview scheduled for 05-14-12 [facility]

## 2012-05-14 DIAGNOSIS — I2510 Atherosclerotic heart disease of native coronary artery without angina pectoris: Secondary | ICD-10-CM

## 2012-05-21 ENCOUNTER — Telehealth: Payer: Self-pay | Admitting: *Deleted

## 2012-05-21 NOTE — Telephone Encounter (Signed)
Message copied by Eustace Moore on Thu May 21, 2012  4:48 PM ------      Message from: Jonelle Sidle      Created: Sat May 16, 2012  7:11 AM       Reviewed. Study is consistent with CAD showing component of scar and peri-infarct ischemia in the inferolateral wall, stable EF 42%. He has likely had some progression in degree of CAD over time, however recent office visit found him clinically stable without progressive angina or CHF symptoms. Would therefore recommend medical therapy and observation at this point. Forward copy with my comments to occupational health provider (DOT).

## 2012-05-21 NOTE — Telephone Encounter (Signed)
Patient informed and copy sent to Dr. Pernell Dupre.

## 2013-05-24 ENCOUNTER — Encounter: Payer: Self-pay | Admitting: Cardiology

## 2013-05-24 ENCOUNTER — Ambulatory Visit (INDEPENDENT_AMBULATORY_CARE_PROVIDER_SITE_OTHER): Payer: Commercial Indemnity | Admitting: Cardiology

## 2013-05-24 VITALS — BP 160/98 | HR 59 | Ht 67.0 in | Wt 180.0 lb

## 2013-05-24 DIAGNOSIS — I10 Essential (primary) hypertension: Secondary | ICD-10-CM

## 2013-05-24 DIAGNOSIS — E785 Hyperlipidemia, unspecified: Secondary | ICD-10-CM

## 2013-05-24 DIAGNOSIS — I2510 Atherosclerotic heart disease of native coronary artery without angina pectoris: Secondary | ICD-10-CM

## 2013-05-24 DIAGNOSIS — F17200 Nicotine dependence, unspecified, uncomplicated: Secondary | ICD-10-CM

## 2013-05-24 NOTE — Assessment & Plan Note (Signed)
We have discussed smoking cessation, he has not been able to quit. 
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
## 2013-05-24 NOTE — Assessment & Plan Note (Signed)
Symptomatically stable, ECG reviewed. Patient underwent Cardiolite last October. 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 We will see him back in August 2015.

## 2013-05-24 NOTE — Patient Instructions (Signed)
Your physician recommends that you schedule a follow-up appointment in: August 2015. You will receive a reminder letter in the mail in about 8 months reminding you to call and schedule your appointment. If you don't receive this letter, please contact our office. Your physician recommends that you continue on your current medications as directed. Please refer to the Current Medication list given to you today.

## 2013-05-24 NOTE — Assessment & Plan Note (Signed)
Needs followup lipid assessment with Dr. Neita Carp. He continues on Crestor.

## 2013-05-24 NOTE — Assessment & Plan Note (Signed)
Blood pressure elevated today. He reports compliance with his medications. Keep follow up with Dr. Neita Carp. Could consider the addition of Norvasc, versus further advancing lisinopril, versus adding HCTZ to lisinopril.

## 2013-07-22 ENCOUNTER — Ambulatory Visit: Payer: PRIVATE HEALTH INSURANCE | Admitting: Cardiology

## 2014-03-31 ENCOUNTER — Encounter: Payer: Self-pay | Admitting: *Deleted

## 2014-03-31 ENCOUNTER — Encounter: Payer: Self-pay | Admitting: Cardiology

## 2014-03-31 ENCOUNTER — Telehealth: Payer: Self-pay | Admitting: Cardiology

## 2014-03-31 ENCOUNTER — Ambulatory Visit (INDEPENDENT_AMBULATORY_CARE_PROVIDER_SITE_OTHER): Payer: Commercial Indemnity | Admitting: Cardiology

## 2014-03-31 VITALS — BP 95/67 | HR 65 | Ht 67.0 in | Wt 168.0 lb

## 2014-03-31 DIAGNOSIS — I10 Essential (primary) hypertension: Secondary | ICD-10-CM

## 2014-03-31 DIAGNOSIS — E785 Hyperlipidemia, unspecified: Secondary | ICD-10-CM

## 2014-03-31 DIAGNOSIS — I2510 Atherosclerotic heart disease of native coronary artery without angina pectoris: Secondary | ICD-10-CM

## 2014-03-31 DIAGNOSIS — F17200 Nicotine dependence, unspecified, uncomplicated: Secondary | ICD-10-CM
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
## 2014-03-31 NOTE — Assessment & Plan Note (Signed)
We will continue medical therapy and plan to proceed with a Lexiscan Cardiolite to reassess ischemic burden. This would be his usual time for repeat DOT physical, although he is presently out of work related to back pain. He also reports shortness of breath but no angina symptoms.

## 2014-03-31 NOTE — Assessment & Plan Note (Signed)
He has not been available to quit smoking.

## 2014-03-31 NOTE — Assessment & Plan Note (Signed)
Blood pressure normal today. 

## 2014-03-31 NOTE — Telephone Encounter (Signed)
Lexiscan Cardiolite on meds dx:CAD Schedule at [facility] on Sept 3 register @ 8:45

## 2014-03-31 NOTE — Assessment & Plan Note (Signed)
Recent lipids reviewed, LDL well controlled Crestor.

## 2014-03-31 NOTE — Patient Instructions (Signed)

## 2014-04-01 NOTE — Telephone Encounter (Signed)
No precert required per [employer]

## 2014-04-07 ENCOUNTER — Encounter (HOSPITAL_COMMUNITY)
Admission: RE | Admit: 2014-04-07 | Discharge: 2014-04-07 | Disposition: A | Discharge status: Home / Self Care | Payer: 59 | Source: Ambulatory Visit | Attending: Cardiology | Admitting: Cardiology

## 2014-04-07 ENCOUNTER — Encounter (HOSPITAL_COMMUNITY): Payer: Self-pay

## 2014-04-07 ENCOUNTER — Ambulatory Visit (HOSPITAL_COMMUNITY)
Admission: RE | Admit: 2014-04-07 | Discharge: 2014-04-07 | Disposition: A | Discharge status: Home / Self Care | Payer: 59 | Source: Ambulatory Visit | Attending: Cardiology | Admitting: Cardiology

## 2014-04-07 DIAGNOSIS — I2510 Atherosclerotic heart disease of native coronary artery without angina pectoris: Secondary | ICD-10-CM | POA: Insufficient documentation

## 2014-04-07 DIAGNOSIS — R0602 Shortness of breath: Secondary | ICD-10-CM | POA: Insufficient documentation

## 2014-04-07 DIAGNOSIS — R9439 Abnormal result of other cardiovascular function study: Secondary | ICD-10-CM | POA: Diagnosis not present

## 2014-04-07 MED ORDER — SODIUM CHLORIDE 0.9 % IJ SOLN
10.0000 mL | INTRAMUSCULAR | Status: DC | PRN
  Administered 2014-04-07: 10 mL via INTRAVENOUS

## 2014-04-07 MED ORDER — REGADENOSON 0.4 MG/5ML IV SOLN
INTRAVENOUS | Status: AC
  Administered 2014-04-07: 0.4 mg via INTRAVENOUS
  Filled 2014-04-07: qty 5

## 2014-04-07 MED ORDER — SODIUM CHLORIDE 0.9 % IJ SOLN
INTRAMUSCULAR | Status: AC
  Administered 2014-04-07: 10 mL via INTRAVENOUS
  Filled 2014-04-07: qty 10

## 2014-04-07 MED ORDER — TECHNETIUM TC 99M SESTAMIBI GENERIC - CARDIOLITE
10.0000 | Freq: Once | INTRAVENOUS | Status: AC | PRN
  Administered 2014-04-07: 10 via INTRAVENOUS

## 2014-04-07 MED ORDER — REGADENOSON 0.4 MG/5ML IV SOLN
0.4000 mg | Freq: Once | INTRAVENOUS | Status: AC | PRN
  Administered 2014-04-07: 0.4 mg via INTRAVENOUS

## 2014-04-07 MED ORDER — TECHNETIUM TC 99M SESTAMIBI - CARDIOLITE
30.0000 | Freq: Once | INTRAVENOUS | Status: AC | PRN
  Administered 2014-04-07: 11:00:00 30 via INTRAVENOUS

## 2014-04-07 NOTE — Progress Notes (Signed)
Stress Lab Nurses Notes - [facility]  Frederick Malone 04/07/2014 Reason for doing test: CAD and Physical Type of test: Marlane Hatcher Nurse performing test: Parke Poisson, RN Nuclear Medicine Tech: Lyndel Pleasure Echo Tech: Not Applicable MD performing test: Branch/K.Lyman Bishop NP Family MD: Neita Carp Test explained and consent signed: Yes.   IV started: Saline lock flushed, No redness or edema and Saline lock started in radiology Symptoms: SOB & Flush Treatment/Intervention: None Reason test stopped: protocol completed After recovery IV was: Discontinued via X-ray tech and No redness or edema Patient to return to Nuc. Med at : 11:30 Patient discharged: Home Patient's Condition upon discharge was: stable Comments: During test BP 101/78 & HR 95.  Recovery BP 109/77 & HR 75.  Symptoms resolved in recovery.  Erskine Speed T

## 2014-04-12 ENCOUNTER — Telehealth: Payer: Self-pay | Admitting: *Deleted

## 2014-04-12 NOTE — Telephone Encounter (Signed)
Message copied by Eustace Moore on Tue Apr 12, 2014  2:12 PM ------      Message from: MCDOWELL, Illene Bolus      Created: Thu Apr 07, 2014  8:52 PM       Reviewed report. Actually it looks better than prior study in 2013 without significant ischemic regions. Suggest continued medical therapy. ------

## 2014-04-12 NOTE — Telephone Encounter (Signed)
Patient informed and copy sent to Dr. Pernell Dupre per patient request.

## 2014-10-21 ENCOUNTER — Ambulatory Visit: Payer: 59 | Admitting: Cardiology

## 2014-11-08 ENCOUNTER — Ambulatory Visit (INDEPENDENT_AMBULATORY_CARE_PROVIDER_SITE_OTHER): Payer: 59 | Admitting: Cardiology

## 2014-11-08 ENCOUNTER — Encounter: Payer: Self-pay | Admitting: Cardiology

## 2014-11-08 VITALS — BP 128/88 | HR 63 | Ht 67.0 in | Wt 173.8 lb

## 2014-11-08 DIAGNOSIS — I10 Essential (primary) hypertension: Secondary | ICD-10-CM

## 2014-11-08 DIAGNOSIS — Z720 Tobacco use: Secondary | ICD-10-CM

## 2014-11-08 DIAGNOSIS — F17200 Nicotine dependence, unspecified, uncomplicated: Secondary | ICD-10-CM

## 2014-11-08 DIAGNOSIS — E782 Mixed hyperlipidemia: Secondary | ICD-10-CM

## 2014-11-08 DIAGNOSIS — I2510 Atherosclerotic heart disease of native coronary artery without angina pectoris: Secondary | ICD-10-CM

## 2014-11-08 NOTE — Patient Instructions (Signed)
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
## 2015-05-09 ENCOUNTER — Ambulatory Visit: Payer: 59 | Admitting: Cardiology

## 2015-06-09 ENCOUNTER — Ambulatory Visit: Payer: 59 | Admitting: Cardiology

## 2015-07-19 ENCOUNTER — Encounter: Payer: Self-pay | Admitting: Cardiology

## 2015-07-19 ENCOUNTER — Ambulatory Visit (INDEPENDENT_AMBULATORY_CARE_PROVIDER_SITE_OTHER): Payer: 59 | Admitting: Cardiology

## 2015-07-19 VITALS — BP 115/75 | HR 72 | Ht 67.0 in | Wt 170.4 lb

## 2015-07-19 DIAGNOSIS — I10 Essential (primary) hypertension: Secondary | ICD-10-CM | POA: Diagnosis not present

## 2015-07-19 DIAGNOSIS — F17200 Nicotine dependence, unspecified, uncomplicated: Secondary | ICD-10-CM

## 2015-07-19 DIAGNOSIS — E782 Mixed hyperlipidemia: Secondary | ICD-10-CM

## 2015-07-19 DIAGNOSIS — I2510 Atherosclerotic heart disease of native coronary artery without angina pectoris: Secondary | ICD-10-CM | POA: Diagnosis not present

## 2015-07-19 DIAGNOSIS — N529 Male erectile dysfunction, unspecified: Secondary | ICD-10-CM | POA: Diagnosis not present
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
## 2015-07-19 NOTE — Patient Instructions (Signed)
Your physician wants you to follow-up in: 6 months with DR. McDowell You will receive a reminder letter in the mail two months in advance. If you don't receive a letter, please call our office to schedule the follow-up appointment.  Your physician recommends that you continue on your current medications as directed. Please refer to the Current Medication list given to you today.  Thank you for choosing [facility]!!    

## 2016-02-05 ENCOUNTER — Ambulatory Visit (INDEPENDENT_AMBULATORY_CARE_PROVIDER_SITE_OTHER): Payer: 59 | Admitting: Cardiology

## 2016-02-05 ENCOUNTER — Encounter: Payer: Self-pay | Admitting: *Deleted

## 2016-02-05 ENCOUNTER — Encounter: Payer: Self-pay | Admitting: Cardiology

## 2016-02-05 VITALS — BP 92/54 | HR 73 | Ht 67.0 in | Wt 165.0 lb

## 2016-02-05 DIAGNOSIS — E782 Mixed hyperlipidemia: Secondary | ICD-10-CM

## 2016-02-05 DIAGNOSIS — F17200 Nicotine dependence, unspecified, uncomplicated: Secondary | ICD-10-CM

## 2016-02-05 DIAGNOSIS — I959 Hypotension, unspecified: Secondary | ICD-10-CM | POA: Diagnosis not present

## 2016-02-05 DIAGNOSIS — I2510 Atherosclerotic heart disease of native coronary artery without angina pectoris: Secondary | ICD-10-CM

## 2016-02-05 DIAGNOSIS — N289 Disorder of kidney and ureter, unspecified: Secondary | ICD-10-CM

## 2016-02-05 NOTE — Patient Instructions (Signed)
Medication Instructions:  Continue all current medications.  Labwork: NONE  Testing/Procedures:  Your physician has requested that you have a lexiscan myoview. For further information please visit [email or web address]. Please follow instruction sheet, as given.  Office will contact with results via phone or letter.    Follow-Up: Your physician wants you to follow up in: 6 months.  You will receive a reminder letter in the mail one-two months in advance.  If you don't receive a letter, please call our office to schedule the follow up appointment    If you need a refill on your cardiac medications before your next appointment, please call your pharmacy.
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
## 2016-02-13 ENCOUNTER — Inpatient Hospital Stay (HOSPITAL_COMMUNITY): Admission: RE | Admit: 2016-02-13 | Payer: 59 | Source: Ambulatory Visit

## 2016-02-13 ENCOUNTER — Encounter (HOSPITAL_COMMUNITY)
Admission: RE | Admit: 2016-02-13 | Discharge: 2016-02-13 | Disposition: A | Discharge status: Home / Self Care | Payer: 59 | Source: Ambulatory Visit | Attending: Cardiology | Admitting: Cardiology

## 2016-02-13 ENCOUNTER — Encounter (HOSPITAL_COMMUNITY): Payer: Self-pay

## 2016-02-13 ENCOUNTER — Encounter (HOSPITAL_COMMUNITY): Payer: 59

## 2016-02-13 DIAGNOSIS — I2510 Atherosclerotic heart disease of native coronary artery without angina pectoris: Secondary | ICD-10-CM | POA: Diagnosis not present

## 2016-02-13 HISTORY — DX: Disorder of kidney and ureter, unspecified: N28.9

## 2016-02-13 MED ORDER — TECHNETIUM TC 99M TETROFOSMIN IV KIT
30.0000 | PACK | Freq: Once | INTRAVENOUS | Status: AC | PRN
  Administered 2016-02-14: 32 via INTRAVENOUS

## 2016-02-13 MED ORDER — REGADENOSON 0.4 MG/5ML IV SOLN
INTRAVENOUS | Status: AC
  Filled 2016-02-13: qty 5

## 2016-02-13 MED ORDER — TECHNETIUM TC 99M TETROFOSMIN IV KIT
10.0000 | PACK | Freq: Once | INTRAVENOUS | Status: AC | PRN
  Administered 2016-02-13: 10.8 via INTRAVENOUS

## 2016-02-13 MED ORDER — SODIUM CHLORIDE 0.9% FLUSH
INTRAVENOUS | Status: AC
  Administered 2016-02-13: 10 mL via INTRAVENOUS
  Filled 2016-02-13: qty 10

## 2016-02-14 ENCOUNTER — Telehealth: Payer: Self-pay | Admitting: Cardiology

## 2016-02-14 LAB — NM MYOCAR MULTI W/SPECT W/WALL MOTION / EF
CHL CUP NUCLEAR SRS: 0
CHL CUP NUCLEAR SSS: 0
LHR: 0.35
LV dias vol: 90 mL (ref 62–150)
LVSYSVOL: 49 mL
NUC STRESS TID: 1.06
Peak HR: 108 {beats}/min
Rest HR: 82 {beats}/min
SDS: 0

## 2016-02-14 MED ORDER — SODIUM CHLORIDE 0.9% FLUSH
INTRAVENOUS | Status: AC
  Administered 2016-02-14: 10 mL via INTRAVENOUS
  Filled 2016-02-14: qty 10

## 2016-02-14 MED ORDER — REGADENOSON 0.4 MG/5ML IV SOLN
INTRAVENOUS | Status: AC
  Administered 2016-02-14: 0.4 mg via INTRAVENOUS
  Filled 2016-02-14: qty 5

## 2016-02-14 NOTE — Telephone Encounter (Signed)
-----   Message from Jonelle SidleSamuel G McDowell, MD sent at 02/14/2016  3:16 PM EDT ----- Results reviewed. Suggests stable CAD with no active ischemic territories. We will continue medical therapy and observation. A copy of this test should be forwarded to [facility],PAUL W, MD.

## 2016-02-14 NOTE — Telephone Encounter (Signed)
Dr. Neita CarpSasser stopped lisinopril on today due to low blood pressures. Patient was told to ask Dr. Diona BrownerMcDowell if he needed to continue his lopressor. Patient advised to continue his metoprolol and keep a log of his blood pressures. Patient advised if his BP is still low, to contact our office. Patient is f/u with PCP on 04/09/16 for kidney functioning and BP.  Patient informed of stress test results and copy sent to PCP and [facility]Ad[facility].

## 2016-02-14 NOTE — Telephone Encounter (Signed)
Dr Neita CarpSasser changed medications told him to contact us about it to see if we want to do more changes

## 2016-12-10 IMAGING — NM NM MYOCAR MULTI W/SPECT W/WALL MOTION & EF
2 series · 12 of 12 positions shown · non-contrast
Comparison: none

[Series 1: rest · 8.28mm/px · 6 of 64 frames shown]
[frame 6/64]
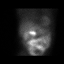
[frame 16/64]
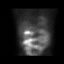
[frame 27/64]
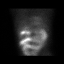
[frame 38/64]
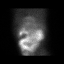
[frame 48/64]
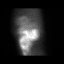
[frame 59/64]
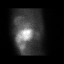

[Series 2: stress gated · 8.28mm/px · 6 of 64 frames shown]
[frame 6/64]
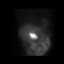
[frame 16/64]
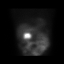
[frame 27/64]
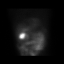
[frame 38/64]
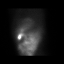
[frame 48/64]
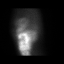
[frame 59/64]
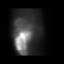

[12 of 12 positions shown; findings below may reference images not displayed]

Canned report from images found in remote index.

Refer to host system for actual result text.

## 2018-09-07 ENCOUNTER — Telehealth: Payer: Self-pay | Admitting: Cardiology

## 2018-09-07 NOTE — Telephone Encounter (Signed)
Numerous attempts to contact patient with recall letters. Unable to reach by telephone. with no success.
